# Patient Record
Sex: Male | Born: 2000 | Race: White | Hispanic: No | Marital: Single
Health system: Southern US, Community
[De-identification: ages and names within clinical notes are randomized; demographics above are authoritative.]

---
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
## 2022-10-16 ENCOUNTER — Encounter: Payer: Self-pay | Admitting: Emergency Medicine

## 2022-10-16 ENCOUNTER — Ambulatory Visit
Admission: EM | Admit: 2022-10-16 | Discharge: 2022-10-16 | Disposition: A | Discharge status: Home / Self Care | Payer: Self-pay | Attending: Internal Medicine | Admitting: Internal Medicine

## 2022-10-16 ENCOUNTER — Other Ambulatory Visit: Payer: Self-pay

## 2022-10-16 DIAGNOSIS — L239 Allergic contact dermatitis, unspecified cause: Secondary | ICD-10-CM

## 2022-10-16 MED ORDER — METHYLPREDNISOLONE SODIUM SUCC 125 MG IJ SOLR: 80.0000 mg | Freq: Once | INTRAMUSCULAR | Status: DC

## 2022-10-16 MED ORDER — PREDNISONE 20 MG PO TABS: 40.0000 mg | ORAL_TABLET | Freq: Every day | ORAL | 0 refills | Status: AC

## 2022-10-16 MED ORDER — METHYLPREDNISOLONE ACETATE 80 MG/ML IJ SUSP
80.0000 mg | Freq: Once | INTRAMUSCULAR | Status: AC
  Administered 2022-10-16: 80 mg via INTRAMUSCULAR

## 2022-10-16 NOTE — ED Provider Notes (Signed)
[facility]    CSN: [id number] Arrival date & time: 10/16/22  1358      History   Chief Complaint Chief Complaint  Patient presents with   Rash    HPI Allen Nunez is a 22 y.o. male.   Patient presents with generalized itchy rash present to face, back, abdomen, chest, bilateral upper extremities, groin area that has been present for about 2 days.  Reports that he was working out in the yard when rash subsequently started afterwards. He is suspicious of poison ivy exposure.  Reports that they have taken Benadryl and applied calamine lotion with minimal improvement.  Denies feelings of throat closing or shortness of breath.   Rash   History reviewed. No pertinent past medical history.  There are no problems to display for this patient.   History reviewed. No pertinent surgical history.     Home Medications    Prior to Admission medications   Medication Sig Start Date End Date Taking? Authorizing Provider  predniSONE (DELTASONE) 20 MG tablet Take 2 tablets (40 mg total) by mouth daily for 5 days. 10/16/22 10/21/22 Yes Teodora Medici, FNP    Family History History reviewed. No pertinent family history.  Social History     Allergies   Patient has no known allergies.   Review of Systems Review of Systems Per HPI  Physical Exam Triage Vital Signs ED Triage Vitals  Enc Vitals Group     BP 10/16/22 1547 (!) 141/55     Pulse Rate 10/16/22 1547 70     Resp 10/16/22 1547 18     Temp 10/16/22 1547 98.1 F (36.7 C)     Temp Source 10/16/22 1547 Oral     SpO2 10/16/22 1547 98 %     Weight --      Height --      Head Circumference --      Peak Flow --      Pain Score 10/16/22 1548 3     Pain Loc --      Pain Edu? --      Excl. in [place]? --    No data found.  Updated Vital Signs BP (!) 141/55 (BP Location: Left Arm)   Pulse 70   Temp 98.1 F (36.7 C) (Oral)   Resp 18   SpO2 98%   Visual Acuity Right Eye Distance:   Left Eye Distance:    Bilateral Distance:    Right Eye Near:   Left Eye Near:    Bilateral Near:     Physical Exam Constitutional:      General: He is not in acute distress.    Appearance: Normal appearance. He is not toxic-appearing or diaphoretic.  HENT:     Head: Normocephalic and atraumatic.  Eyes:     Extraocular Movements: Extraocular movements intact.     Conjunctiva/sclera: Conjunctivae normal.  Pulmonary:     Effort: Pulmonary effort is normal.  Genitourinary:    Comments: Deferred with shared decision making. Patient reports groin rash is the same as other areas of the body and started at the same time.  Skin:    Comments: Patient has erythematous patchy rash present to back, chest, abdomen, bilateral upper extremities, cheeks of face.  Very mild swelling with rash to cheeks of face.  Neurological:     General: No focal deficit present.     Mental Status: He is alert and oriented to person, place, and time. Mental status is at baseline.  Psychiatric:  Mood and Affect: Mood normal.        Behavior: Behavior normal.        Thought Content: Thought content normal.        Judgment: Judgment normal.      UC Treatments / Results  Labs (all labs ordered are listed, but only abnormal results are displayed) Labs Reviewed - No data to display  EKG   Radiology No results found.  Procedures Procedures (including critical care time)  Medications Ordered in UC Medications  methylPREDNISolone acetate (DEPO-MEDROL) injection 80 mg (80 mg Intramuscular Given 10/16/22 1600)    Initial Impression / Assessment and Plan / UC Course  I have reviewed the triage vital signs and the nursing notes.  Pertinent labs & imaging results that were available during my care of the patient were reviewed by me and considered in my medical decision making (see chart for details).     Rash is consistent with allergic reaction most likely poison ivy dermatitis.  Given how diffuse rash is and very mild  swelling noted to the face, will treat with IM steroid here in urgent care.  Also treating with oral steroid that the patient will start taking tomorrow.  Advised patient to not start oral steroid until tomorrow. Advised supportive care.  No signs of anaphylaxis on exam.  Discussed return and ER precautions.  Patient verbalized understanding and was agreeable with plan. Final Clinical Impressions(s) / UC Diagnoses   Final diagnoses:  Allergic contact dermatitis, unspecified trigger     Discharge Instructions      You were given a steroid shot today in urgent care.  I have also prescribed prednisone which you will start taking tomorrow.  Follow-up if any symptoms persist or worsen.    ED Prescriptions     Medication Sig Dispense Auth. Provider   predniSONE (DELTASONE) 20 MG tablet Take 2 tablets (40 mg total) by mouth daily for 5 days. 10 tablet Teodora Medici, [place]      PDMP not reviewed this encounter.   Teodora Medici, [place] 10/16/22 [phone]

## 2022-10-16 NOTE — ED Triage Notes (Signed)
Pt here for generalized rash from poison ivy x 2 days

## 2022-10-16 NOTE — Discharge Instructions (Signed)
You were given a steroid shot today in urgent care.  I have also prescribed prednisone which you will start taking tomorrow.  Follow-up if any symptoms persist or worsen.
